# Patient Record
Sex: Male | Born: 2000
Health system: Southern US, Community
[De-identification: ages and names within clinical notes are randomized; demographics above are authoritative.]

## PROBLEM LIST (undated history)

## (undated) DIAGNOSIS — J45909 Unspecified asthma, uncomplicated: Secondary | ICD-10-CM

## (undated) DIAGNOSIS — F419 Anxiety disorder, unspecified: Secondary | ICD-10-CM

## (undated) HISTORY — DX: Unspecified asthma, uncomplicated: J45.909

## (undated) HISTORY — PX: TONSILLECTOMY AND ADENOIDECTOMY: SHX28

## (undated) HISTORY — DX: Anxiety disorder, unspecified: F41.9

---
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
## 2021-01-12 DIAGNOSIS — F4321 Adjustment disorder with depressed mood: Secondary | ICD-10-CM | POA: Diagnosis not present

## 2021-06-29 ENCOUNTER — Telehealth: Payer: Self-pay

## 2021-06-29 NOTE — Telephone Encounter (Signed)
Patient's mother Claris Gower) states child has to transition from pediatric and is wondering if Dr. Laury Axon would take him.

## 2021-06-30 NOTE — Telephone Encounter (Signed)
Scheduled

## 2021-07-20 ENCOUNTER — Other Ambulatory Visit: Payer: Self-pay

## 2021-07-20 ENCOUNTER — Ambulatory Visit: Payer: BC Managed Care – PPO | Admitting: Family Medicine

## 2021-07-20 ENCOUNTER — Encounter: Payer: Self-pay | Admitting: Family Medicine

## 2021-07-20 VITALS — BP 120/78 | HR 75 | Temp 97.7°F | Resp 18 | Ht 68.5 in | Wt 209.0 lb

## 2021-07-20 DIAGNOSIS — R002 Palpitations: Secondary | ICD-10-CM

## 2021-07-20 DIAGNOSIS — Z0000 Encounter for general adult medical examination without abnormal findings: Secondary | ICD-10-CM

## 2021-07-20 DIAGNOSIS — F419 Anxiety disorder, unspecified: Secondary | ICD-10-CM | POA: Diagnosis not present

## 2021-07-20 NOTE — Assessment & Plan Note (Signed)
Check labs  ekg-- nsr Event monitor  Most likely due to anxiety

## 2021-07-20 NOTE — Patient Instructions (Signed)
Preventive Care 18-19 Years Old, Male Preventive care refers to lifestyle choices and visits with your health care provider that can promote health and wellness. At this stage in your life, you may start seeing a primary care physician instead of a pediatrician. It is important to take responsibility for your health and well-being. Preventive care for young adults includes: A yearly physical exam. This is also called an annual wellness visit. Regular dental and eye exams. Immunizations. Screening for certain conditions. Healthy lifestyle choices, such as: Eating a healthy diet. Getting regular exercise. Not using drugs or products that contain nicotine and tobacco. Limiting alcohol use. What can I expect for my preventive care visit? Physical exam Your health care provider may check your: Height and weight. These may be used to calculate your BMI (body mass index). BMI is a measurement that tells if you are at a healthy weight. Heart rate and blood pressure. Body temperature. Skin for abnormal spots. Counseling Your health care provider may ask you questions about your: Past medical problems. Family's medical history. Alcohol, tobacco, and drug use. Home life and relationship well-being. Access to firearms. Emotional well-being. Diet, exercise, and sleep habits. Sexual activity and sexual health. What immunizations do I need?  Vaccines are usually given at various ages, according to a schedule. Your health care provider will recommend vaccines for you based on your age, medicalhistory, and lifestyle or other factors, such as travel or where you work. What tests do I need? Blood tests Lipid and cholesterol levels. These may be checked every 5 years starting at age 20. Hepatitis C test. Hepatitis B test. Screening Genital exam to check for testicular cancer or hernias. STD (sexually transmitted disease) testing, if you are at risk. Other tests Tuberculosis skin test. Vision and  hearing tests. Skin exam. Talk with your health care provider about your test results, treatment options,and if necessary, the need for more tests. Follow these instructions at home: Eating and drinking  Eat a healthy diet that includes fresh fruits and vegetables, whole grains, lean protein, and low-fat dairy products. Drink enough fluid to keep your urine pale yellow. Do not drink alcohol if: Your health care provider tells you not to drink. You are under the legal drinking age. In the [place] the legal drinking age is 21. If you drink alcohol: Limit how much you use to 0-2 drinks a day. Be aware of how much alcohol is in your drink. In the [place] one drink equals one 12 oz bottle of beer (355 mL), one 5 oz glass of wine (148 mL), or one 1 oz glass of hard liquor (44 mL).  Lifestyle Take daily care of your teeth and gums. Brush your teeth every morning and night with fluoride toothpaste. Floss one time each day. Stay active. Exercise for at least 30 minutes 5 or more days of the week. Do not use any products that contain nicotine or tobacco, such as cigarettes, e-cigarettes, and chewing tobacco. If you need help quitting, ask your health care provider. Do not use drugs. If you are sexually active, practice safe sex. Use a condom or other form of protection to prevent STIs (sexually transmitted infections). Find healthy ways to cope with stress, such as: Meditation, yoga, or listening to music. Journaling. Talking to a trusted person. Spending time with friends and family. Safety Always wear your seat belt while driving or riding in a vehicle. Do not drive: If you have been drinking alcohol. Do not ride with someone who has been drinking.   When you are tired or distracted. While texting. Wear a helmet and other protective equipment during sports activities. If you have firearms in your house, make sure you follow all gun safety procedures. Seek help if you have been bullied,  physically abused, or sexually abused. Use the Internet responsibly to avoid dangers, such as online bullying and online sex predators. What's next? Go to your health care provider once a year for an annual wellness visit. Ask your health care provider how often you should have your eyes and teeth checked. Stay up to date on all vaccines. This information is not intended to replace advice given to you by your health care provider. Make sure you discuss any questions you have with your healthcare provider. Document Revised: 08/19/2019 Document Reviewed: 11/27/2018 Elsevier Patient Education  2022 [employer].

## 2021-07-20 NOTE — Assessment & Plan Note (Signed)
Pt had been In counseling

## 2021-07-20 NOTE — Progress Notes (Signed)
Subjective:   By signing my name below, I, Shehryar Baig, attest that this documentation has been prepared under the direction and in the presence of Dr. Roma Schanz, DO. 07/20/2021    Patient ID: Robert Raymond, male    DOB: 2001-02-03, 20 y.o.   MRN: [id number]  Chief Complaint  Patient presents with   New Patient (Initial Visit)    Pt states wanting to discuss htn. Pt states he checked his bp at [facility] and it was high.    HPI Patient is in today for a new patient visit. He reports that he checked his blood pressure at public [employer] blood pressure cuff and found that his blood pressure is elevated. He thinks it is due to exercising prior to taking the measurement. He reports his mother and older brother have a history of hypertension.  He has a family history of hypertension, otherwise he does not know any other family history at this time.  He had asthma as a child. He repots having mild anxiety that has improved since he found it. He used to see a therapist to manage his symptoms but has stopped seeing her recently. He also notes having heart palpitations due to anxiety. He notes that since exercising regularly his episodes of heart palpitations have reduced but notes having a recent episode while at the beach. He mentions having constant stress due to anxiety but his heart palpations typically have random onsets but occur more frequently while under more stress. He occasionally drinks beer. He does not use any drugs. He is sexually active with one person. He uses condoms with his partner. He does smoke tobacco products. He used to use vaping products but has stopped using them since 8 months ago around January, 2022.  He participates in exercise by working out in the gym 5 days a week for around 1-2 hours. He also play golf as a hobby.   He reports being UTD on the HPV vaccine. He is due for the tetanus vaccine.  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  He is getting lab work after this visit. He last ate food at 12:00 pm. He denies having any joint pain and new moles to report at this time.     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  Tobacco Use   Smoking status: Never   Smokeless tobacco: Never  Vaping Use   Vaping Use: Former   Quit date: 12/28/2020  Substance and Sexual Activity   Alcohol use: Yes    Comment: <1 a week   Drug use: Never   Sexual activity: Yes    Partners: Female    Birth control/protection: Condom  Other Topics Concern   Not on file  Social History Narrative  Works out at gym-- 5 days a week 1.5 hours    Social Determinants of Radio broadcast assistant Strain: Not on file  Food Insecurity: Not on file  Transportation Needs: Not on file  Physical Activity: Not on file  Stress: Not on file  Social Connections: Not on file  Intimate Partner Violence: Not on file    No outpatient medications prior to visit.   No facility-administered medications prior to visit.    No Known  Allergies  Review of Systems  Musculoskeletal:  Negative for joint pain.  Skin:        (-)new moles  Psychiatric/Behavioral:  The patient is nervous/anxious.       Objective:    Physical Exam Constitutional:      General: He is not in acute distress.    Appearance: Normal appearance. He is not ill-appearing.  HENT:     Head: Normocephalic and atraumatic.     Right Ear: Tympanic membrane, ear canal and external ear normal.     Left Ear: Tympanic membrane, ear canal and external ear normal.  Eyes:     Extraocular Movements: Extraocular movements intact.     Pupils: Pupils are equal, round, and reactive to light.  Cardiovascular:     Rate and Rhythm: Normal rate and regular rhythm.     Heart sounds: Normal heart sounds. No murmur heard.   No gallop.  Pulmonary:     Effort: Pulmonary effort is normal. No respiratory distress.     Breath sounds: Normal breath sounds. No wheezing or rales.  Skin:    General: Skin is warm and dry.  Neurological:     Mental Status: He is alert and oriented to person, place, and time.  Psychiatric:        Behavior: Behavior normal.    BP 120/78 (BP Location: Right Arm, Patient Position: Sitting, Cuff Size: Large)   Pulse 75   Temp 97.7 F (36.5 C) (Oral)   Resp 18   Ht 5' 8.5" (1.74 m)   Wt 209 lb (94.8 kg)   SpO2 97%   BMI 31.32 kg/m  Wt Readings from Last 3 Encounters:  07/20/21 209 lb (94.8 kg) (95 %, Z= 1.67)*   * Growth percentiles are based on CDC (Boys, 2-20 Years) data.    Diabetic Foot Exam - Simple   No data filed    No results found for: WBC, HGB, HCT, PLT, GLUCOSE, CHOL, TRIG, HDL, LDLDIRECT, LDLCALC, ALT, AST, NA, K, CL, CREATININE, BUN, CO2, TSH, PSA, INR, GLUF, HGBA1C, MICROALBUR  No results found for: TSH No results found for: WBC, HGB, HCT, MCV, PLT No results found for: NA, K, CHLORIDE, CO2, GLUCOSE, BUN, CREATININE, BILITOT, ALKPHOS, AST, ALT, PROT, ALBUMIN, CALCIUM, ANIONGAP, EGFR, GFR No results found for:  CHOL No results found for: HDL No results found for: LDLCALC No results found for: TRIG No results found for: CHOLHDL No results found for: HGBA1C     Assessment & Plan:   Problem List Items Addressed This Visit       Unprioritized   Anxiety    Pt had been In counseling         Palpitation    Check labs  ekg-- nsr Event monitor  Most likely due to anxiety        Relevant Orders   EKG 12-Lead (Completed)   Cardiac event monitor   Preventative health care - Primary    ghm utd Check labs  See avs  Relevant Orders   Lipid panel   CBC with Differential/Platelet   TSH   Comprehensive metabolic panel     No orders of the defined types were placed in this encounter.   I, Dr. Roma Schanz, DO, personally preformed the services described in this documentation.  All medical record entries made by the scribe were at my direction and in my presence.  I have reviewed the chart and discharge instructions (if applicable) and agree that the record reflects my personal performance and is accurate and complete. 07/20/2021   I,Shehryar Baig,acting as a scribe for Ann Held, DO.,have documented all relevant documentation on the behalf of Ann Held, DO,as directed by  Ann Held, DO while in the presence of Ann Held, DO.   Ann Held, DO

## 2021-07-20 NOTE — Assessment & Plan Note (Signed)
ghm utd Check labs  See avs  

## 2021-07-21 LAB — LIPID PANEL
Cholesterol: 177 mg/dL (ref 0–200)
HDL: 39 mg/dL — ABNORMAL LOW (ref >39.00)
NonHDL: 138.27
Total CHOL/HDL Ratio: 5
Triglycerides: 223 mg/dL — ABNORMAL HIGH (ref 0.0–149.0)
VLDL: 44.6 mg/dL — ABNORMAL HIGH (ref 0.0–40.0)

## 2021-07-21 LAB — CBC WITH DIFFERENTIAL/PLATELET
Basophils Absolute: 0 10*3/uL (ref 0.0–0.1)
Basophils Relative: 0.7 % (ref 0.0–3.0)
Eosinophils Absolute: 0.1 10*3/uL (ref 0.0–0.7)
Eosinophils Relative: 1.6 % (ref 0.0–5.0)
HCT: 43.1 % (ref 36.0–49.0)
Hemoglobin: 14.6 g/dL (ref 12.0–16.0)
Lymphocytes Relative: 27.4 % (ref 24.0–48.0)
Lymphs Abs: 1.8 10*3/uL (ref 0.7–4.0)
MCHC: 33.8 g/dL (ref 31.0–37.0)
MCV: 85.8 fl (ref 78.0–98.0)
Monocytes Absolute: 0.9 10*3/uL (ref 0.1–1.0)
Monocytes Relative: 13.7 % — ABNORMAL HIGH (ref 3.0–12.0)
Neutro Abs: 3.7 10*3/uL (ref 1.4–7.7)
Neutrophils Relative %: 56.6 % (ref 43.0–71.0)
Platelets: 191 10*3/uL (ref 150.0–575.0)
RBC: 5.02 Mil/uL (ref 3.80–5.70)
RDW: 13.3 % (ref 11.4–15.5)
WBC: 6.5 10*3/uL (ref 4.5–13.5)

## 2021-07-21 LAB — TSH: TSH: 1.56 u[IU]/mL (ref 0.40–5.00)

## 2021-07-21 LAB — COMPREHENSIVE METABOLIC PANEL
ALT: 34 U/L (ref 0–53)
AST: 27 U/L (ref 0–37)
Albumin: 4.6 g/dL (ref 3.5–5.2)
Alkaline Phosphatase: 110 U/L (ref 52–171)
BUN: 21 mg/dL (ref 6–23)
CO2: 30 mEq/L (ref 19–32)
Calcium: 9.7 mg/dL (ref 8.4–10.5)
Chloride: 102 mEq/L (ref 96–112)
Creatinine, Ser: 1 mg/dL (ref 0.40–1.50)
GFR: 109.31 mL/min (ref >60.00)
Glucose, Bld: 86 mg/dL (ref 70–99)
Potassium: 4.7 mEq/L (ref 3.5–5.1)
Sodium: 139 mEq/L (ref 135–145)
Total Bilirubin: 0.5 mg/dL (ref 0.2–1.2)
Total Protein: 7.1 g/dL (ref 6.0–8.3)

## 2021-07-21 LAB — LDL CHOLESTEROL, DIRECT: Direct LDL: 118 mg/dL

## 2022-05-30 ENCOUNTER — Encounter: Payer: Self-pay | Admitting: Emergency Medicine

## 2022-05-30 ENCOUNTER — Ambulatory Visit
Admission: EM | Admit: 2022-05-30 | Discharge: 2022-05-30 | Disposition: A | Discharge status: Home / Self Care | Payer: 59 | Attending: Urgent Care | Admitting: Urgent Care

## 2022-05-30 DIAGNOSIS — H6981 Other specified disorders of Eustachian tube, right ear: Secondary | ICD-10-CM | POA: Diagnosis not present

## 2022-05-30 MED ORDER — LEVOCETIRIZINE DIHYDROCHLORIDE 5 MG PO TABS: 5.0000 mg | ORAL_TABLET | Freq: Every evening | ORAL | 0 refills | Status: DC

## 2022-05-30 MED ORDER — AMOXICILLIN 875 MG PO TABS: 875.0000 mg | ORAL_TABLET | Freq: Two times a day (BID) | ORAL | 0 refills | Status: DC

## 2022-05-30 MED ORDER — FLUTICASONE PROPIONATE 50 MCG/ACT NA SUSP: 2.0000 | Freq: Every day | NASAL | 12 refills | Status: DC

## 2022-05-30 MED ORDER — PSEUDOEPHEDRINE HCL 60 MG PO TABS: 60.0000 mg | ORAL_TABLET | Freq: Three times a day (TID) | ORAL | 0 refills | Status: DC | PRN

## 2022-05-30 NOTE — ED Provider Notes (Signed)
  [facility]   MRN: [id number] DOB: [birth year]  Subjective:   Oluwaseun Cremer is a 21 y.o. male presenting for 1 week history of persistent intermittent right ear fullness, popping.  Has had some intermittent pain as well.  No fever, drainage, tinnitus, dizziness.  Patient works in [employer] and has a history of childhood asthma, allergies.  Does not take anything consistently for this.  No current facility-administered medications for this encounter. No current outpatient medications on file.   No Known Allergies  Past Medical History:  Diagnosis Date   Anxiety    Childhood asthma      Past Surgical History:  Procedure Laterality Date   TONSILLECTOMY AND ADENOIDECTOMY      Family History  Problem Relation Age of Onset   Hypertension Mother    Hypertension Brother     Social History   Tobacco Use   Smoking status: Never   Smokeless tobacco: Never  Vaping Use   Vaping Use: Former   Quit date: 12/28/2020  Substance Use Topics   Alcohol use: Yes    Comment: <1 a week   Drug use: Never    ROS   Objective:   Vitals: BP 136/80   Pulse 73   Temp 98.6 F (37 C)   Resp 20   SpO2 98%   Physical Exam Constitutional:      General: He is not in acute distress.    Appearance: Normal appearance. He is well-developed and normal weight. He is not ill-appearing, toxic-appearing or diaphoretic.  HENT:     Head: Normocephalic and atraumatic.     Right Ear: Tympanic membrane, ear canal and external ear normal. There is no impacted cerumen.     Left Ear: Tympanic membrane, ear canal and external ear normal. There is no impacted cerumen.     Nose: Nose normal.     Mouth/Throat:     Pharynx: Oropharynx is clear.  Eyes:     General: No scleral icterus.       Right eye: No discharge.        Left eye: No discharge.     Extraocular Movements: Extraocular movements intact.  Cardiovascular:     Rate and Rhythm: Normal rate.  Pulmonary:      Effort: Pulmonary effort is normal.  Musculoskeletal:     Cervical back: Normal range of motion.  Neurological:     Mental Status: He is alert and oriented to person, place, and time.  Psychiatric:        Mood and Affect: Mood normal.        Behavior: Behavior normal.        Thought Content: Thought content normal.        Judgment: Judgment normal.    Assessment and Plan :   PDMP not reviewed this encounter.  1. Eustachian tube dysfunction, right    Unremarkable ENT exam.  Will use conservative management for what I suspect is eustachian tube dysfunction.  Recommended starting Flonase, Xyzal, Sudafed.  I did provide patient with a prescription for amoxicillin in the event that he has no improvement or develops worsening symptoms including fever, persistent pain then go ahead and fill prescription for amoxicillin to address otitis media.  Counseled patient on potential for adverse effects with medications prescribed/recommended today, ER and return-to-clinic precautions discussed, patient verbalized understanding.    Wallis Bamberg, PA-C 05/30/22 1120

## 2022-05-30 NOTE — ED Triage Notes (Signed)
Pt here with right ear pain and fullness x 1 week.

## 2022-07-23 ENCOUNTER — Encounter: Payer: BC Managed Care – PPO | Admitting: Family Medicine

## 2023-09-19 ENCOUNTER — Ambulatory Visit: Payer: Commercial Managed Care - PPO | Admitting: Physician Assistant

## 2023-09-19 ENCOUNTER — Encounter: Payer: Self-pay | Admitting: Physician Assistant

## 2023-09-19 VITALS — BP 126/76 | HR 91 | Temp 98.1°F | Resp 16 | Ht 68.5 in | Wt 220.2 lb

## 2023-09-19 DIAGNOSIS — R5383 Other fatigue: Secondary | ICD-10-CM

## 2023-09-19 DIAGNOSIS — R590 Localized enlarged lymph nodes: Secondary | ICD-10-CM | POA: Diagnosis not present

## 2023-09-19 LAB — CBC WITH DIFFERENTIAL/PLATELET
Basophils Absolute: 0.1 10*3/uL (ref 0.0–0.1)
Basophils Relative: 0.8 % (ref 0.0–3.0)
Eosinophils Absolute: 0.1 10*3/uL (ref 0.0–0.7)
Eosinophils Relative: 0.8 % (ref 0.0–5.0)
HCT: 43.1 % (ref 39.0–52.0)
Hemoglobin: 14.8 g/dL (ref 13.0–17.0)
Lymphocytes Relative: 35.5 % (ref 12.0–46.0)
Lymphs Abs: 2.6 10*3/uL (ref 0.7–4.0)
MCHC: 34.5 g/dL (ref 30.0–36.0)
MCV: 86.1 fL (ref 78.0–100.0)
Monocytes Absolute: 1.1 10*3/uL — ABNORMAL HIGH (ref 0.1–1.0)
Monocytes Relative: 15.4 % — ABNORMAL HIGH (ref 3.0–12.0)
Neutro Abs: 3.5 10*3/uL (ref 1.4–7.7)
Neutrophils Relative %: 47.5 % (ref 43.0–77.0)
Platelets: 164 10*3/uL (ref 150.0–400.0)
RBC: 5.01 Mil/uL (ref 4.22–5.81)
RDW: 12.6 % (ref 11.5–15.5)
WBC: 7.3 10*3/uL (ref 4.0–10.5)

## 2023-09-19 NOTE — Progress Notes (Signed)
Established patient visit   Patient: Robert Raymond   DOB: October 23, 2001   22 y.o. Male  MRN: [id number] Visit Date: 09/19/2023  Today's healthcare provider: Alfredia Ferguson, PA-C   Cc. Enlarged nodes in neck x 1 week  Subjective    HPI  Pt reports for the last week appreciating several painful lumps on the right side of his neck. Tender to touch. Denies recent illness. Some pain while swallowing. Reports some fatigue. He is also interested in getting his testosterone checked.  He sometimes experiences some pain along his left ribs with coughing/deep breathing that is new as well.   Medications: Outpatient Medications Prior to Visit  Medication Sig   [DISCONTINUED] amoxicillin (AMOXIL) 875 MG tablet Take 1 tablet (875 mg total) by mouth 2 (two) times daily. (Patient not taking: Reported on 09/19/2023)   [DISCONTINUED] fluticasone (FLONASE) 50 MCG/ACT nasal spray Place 2 sprays into both nostrils daily. (Patient not taking: Reported on 09/19/2023)   [DISCONTINUED] levocetirizine (XYZAL) 5 MG tablet Take 1 tablet (5 mg total) by mouth every evening. (Patient not taking: Reported on 09/19/2023)   [DISCONTINUED] pseudoephedrine (SUDAFED) 60 MG tablet Take 1 tablet (60 mg total) by mouth every 8 (eight) hours as needed for congestion. (Patient not taking: Reported on 09/19/2023)   No facility-administered medications prior to visit.    Review of Systems  Constitutional:  Negative for fatigue and fever.  Respiratory:  Negative for cough and shortness of breath.   Cardiovascular:  Negative for chest pain, palpitations and leg swelling.  Neurological:  Negative for dizziness and headaches.      Objective    BP 126/76   Pulse 91   Temp 98.1 F (36.7 C) (Oral)   Resp 16   Ht 5' 8.5" (1.74 m)   Wt 220 lb 4 oz (99.9 kg)   SpO2 97%   BMI 33.00 kg/m   Physical Exam Constitutional:      General: He is awake.     Appearance: He is well-developed.  HENT:     Head: Normocephalic.      Right Ear: Tympanic membrane normal.     Left Ear: Tympanic membrane normal.     Mouth/Throat:     Pharynx: Posterior oropharyngeal erythema present. No oropharyngeal exudate.     Comments: No tonsilar exudates or edema Eyes:     Conjunctiva/sclera: Conjunctivae normal.  Neck:     Comments: Right post cervical chain is enlarged -- but soft, mobile. Largest node is 1-2 cm No palpable mass felt right tonsilar node area where pt is tender. Overall some minimal edema to right side of neck when comparing bilaterally.   Cardiovascular:     Rate and Rhythm: Normal rate and regular rhythm.     Heart sounds: Normal heart sounds.  Pulmonary:     Effort: Pulmonary effort is normal.     Breath sounds: Normal breath sounds.  Skin:    General: Skin is warm.  Neurological:     Mental Status: He is alert and oriented to person, place, and time.  Psychiatric:        Attention and Perception: Attention normal.        Mood and Affect: Mood normal.        Speech: Speech normal.        Behavior: Behavior is cooperative.      Results for orders placed or performed in visit on 09/19/23  CBC w/Diff  Result Value Ref Range   WBC 7.3  4.0 - 10.5 K/uL   RBC 5.01 4.22 - 5.81 Mil/uL   Hemoglobin 14.8 13.0 - 17.0 g/dL   HCT 82.9 56.2 - 13.0 %   MCV 86.1 78.0 - 100.0 fl   MCHC 34.5 30.0 - 36.0 g/dL   RDW 86.5 78.4 - 69.6 %   Platelets 164.0 150.0 - 400.0 K/uL   Neutrophils Relative % 47.5 43.0 - 77.0 %   Lymphocytes Relative 35.5 12.0 - 46.0 %   Monocytes Relative 15.4 (H) 3.0 - 12.0 %   Eosinophils Relative 0.8 0.0 - 5.0 %   Basophils Relative 0.8 0.0 - 3.0 %   Neutro Abs 3.5 1.4 - 7.7 K/uL   Lymphs Abs 2.6 0.7 - 4.0 K/uL   Monocytes Absolute 1.1 (H) 0.1 - 1.0 K/uL   Eosinophils Absolute 0.1 0.0 - 0.7 K/uL   Basophils Absolute 0.1 0.0 - 0.1 K/uL    Assessment & Plan     1. Lymphadenopathy of right cervical region Will check cbc, ebv.  Advised pt to monitor -- if continues or worsens  over the next week to call/contact office and I would recommend an ultrasound.  - CBC w/Diff - Epstein-Barr virus VCA antibody panel  2. Other fatigue Per pt preference will check testosterone - Testosterone,Free and Total   Return if symptoms worsen or fail to improve.      I, Alfredia Ferguson, PA-C have reviewed all documentation for this visit. The documentation on  09/19/23   for the exam, diagnosis, procedures, and orders are all accurate and complete.    Alfredia Ferguson, PA-C  [facility] [place] [phone] (phone) [phone] (fax)  [facility]

## 2023-09-20 ENCOUNTER — Encounter: Payer: Self-pay | Admitting: Physician Assistant

## 2023-09-20 LAB — EPSTEIN-BARR VIRUS VCA ANTIBODY PANEL
EBV NA IgG: <18 U/mL
EBV VCA IgG: <18 U/mL
EBV VCA IgM: <36 U/mL

## 2023-09-22 LAB — TESTOSTERONE,FREE AND TOTAL
Testosterone, Free: 20 pg/mL (ref 9.3–26.5)
Testosterone: 238 ng/dL — ABNORMAL LOW (ref 264–916)

## 2023-09-23 ENCOUNTER — Other Ambulatory Visit: Payer: Self-pay | Admitting: Physician Assistant

## 2023-09-23 DIAGNOSIS — R590 Localized enlarged lymph nodes: Secondary | ICD-10-CM

## 2023-09-24 ENCOUNTER — Ambulatory Visit: Payer: Commercial Managed Care - PPO | Admitting: Physician Assistant

## 2024-05-28 ENCOUNTER — Other Ambulatory Visit: Payer: Self-pay

## 2024-05-28 ENCOUNTER — Ambulatory Visit
Admission: EM | Admit: 2024-05-28 | Discharge: 2024-05-28 | Disposition: A | Discharge status: Home / Self Care | Attending: Nurse Practitioner | Admitting: Nurse Practitioner

## 2024-05-28 DIAGNOSIS — L03116 Cellulitis of left lower limb: Secondary | ICD-10-CM | POA: Diagnosis not present

## 2024-05-28 DIAGNOSIS — Z23 Encounter for immunization: Secondary | ICD-10-CM | POA: Diagnosis not present

## 2024-05-28 MED ORDER — CEPHALEXIN 500 MG PO CAPS: 500.0000 mg | ORAL_CAPSULE | Freq: Four times a day (QID) | ORAL | 0 refills | Status: AC

## 2024-05-28 MED ORDER — TETANUS-DIPHTH-ACELL PERTUSSIS 5-2.5-18.5 LF-MCG/0.5 IM SUSY
0.5000 mL | PREFILLED_SYRINGE | Freq: Once | INTRAMUSCULAR | Status: AC
  Administered 2024-05-28: 0.5 mL via INTRAMUSCULAR

## 2024-05-28 MED ORDER — MUPIROCIN 2 % EX OINT: 1.0000 | TOPICAL_OINTMENT | Freq: Two times a day (BID) | CUTANEOUS | 0 refills | Status: AC

## 2024-05-28 NOTE — ED Triage Notes (Signed)
 Left leg pain r/t sliding into base during baseball. Has large abrasion. Had fever last night. Took ibuprofen.

## 2024-05-28 NOTE — ED Provider Notes (Signed)
 [name]    CSN: [id number] Arrival date & time: 05/28/24  1048      History   Chief Complaint Chief Complaint  Patient presents with   Leg Injury    HPI Robert Raymond is a 23 y.o. male.   Patient presents today with concern for infection of left lower extremity after he sustained a large abrasion playing softball 2 days ago.  Reports the abrasion occurred while sliding into second base.  He has been cleaning the area with peroxide, soap and water, and applying antiseptic ointment.  Reports after his softball game last night, he came home and felt like he had a fever with bodyaches and chills.  He also noted puslike discharge coming from the wound.  No nausea or vomiting.  No red streaking up the leg.  There is a little bit of swelling around the wound.  No numbness or tingling distal to the wound.    Past Medical History:  Diagnosis Date   Anxiety    Childhood asthma     Patient Active Problem List   Diagnosis Date Noted   Preventative health care 07/20/2021   Palpitation 07/20/2021   Anxiety 07/20/2021    Past Surgical History:  Procedure Laterality Date   TONSILLECTOMY AND ADENOIDECTOMY         Home Medications    Prior to Admission medications   Medication Sig Start Date End Date Taking? Authorizing Provider  cephALEXin (KEFLEX) 500 MG capsule Take 1 capsule (500 mg total) by mouth 4 (four) times daily for 5 days. 05/28/24 06/02/24 Yes Wilhemena Harbour, NP  mupirocin ointment (BACTROBAN) 2 % Apply 1 Application topically 2 (two) times daily for 5 days. 05/28/24 06/02/24 Yes Wilhemena Harbour, NP    Family History Family History  Problem Relation Age of Onset   Hypertension Mother    Hypertension Brother     Social History Social History   Tobacco Use   Smoking status: Some Days    Types: Cigarettes   Smokeless tobacco: Never  Vaping Use   Vaping status: Some Days  Substance Use Topics   Alcohol use: Yes    Comment: <1 a week    Drug use: Never     Allergies   Patient has no known allergies.   Review of Systems Review of Systems Per HPI  Physical Exam Triage Vital Signs ED Triage Vitals  Encounter Vitals Group     BP 05/28/24 1058 (!) 141/84     Girls Systolic BP Percentile --      Girls Diastolic BP Percentile --      Boys Systolic BP Percentile --      Boys Diastolic BP Percentile --      Pulse Rate 05/28/24 1058 79     Resp 05/28/24 1058 16     Temp 05/28/24 1058 97.9 F (36.6 C)     Temp src --      SpO2 05/28/24 1058 99 %     Weight --      Height --      Head Circumference --      Peak Flow --      Pain Score 05/28/24 1100 6     Pain Loc --      Pain Education --      Exclude from Growth Chart --    No data found.  Updated Vital Signs BP (!) 141/84   Pulse 79   Temp 97.9 F (36.6 C)   Resp  16   SpO2 99%   Visual Acuity Right Eye Distance:   Left Eye Distance:   Bilateral Distance:    Right Eye Near:   Left Eye Near:    Bilateral Near:     Physical Exam Vitals and nursing note reviewed.  Constitutional:      General: He is not in acute distress.    Appearance: Normal appearance. He is not toxic-appearing.  HENT:     Head: Normocephalic and atraumatic.     Mouth/Throat:     Mouth: Mucous membranes are moist.  Pulmonary:     Effort: Pulmonary effort is normal. No respiratory distress.   Skin:    General: Skin is warm and dry.     Capillary Refill: Capillary refill takes less than 2 seconds.     Coloration: Skin is not jaundiced or pale.     Findings: Abrasion present. No rash.     Comments: See attached photograph.  There is tenderness to palpation greater than 2 cm in distance from the wound edges.  No red streaking up the legs.  No significant edema or discoloration of the extremity distal to the wound.   Neurological:     Mental Status: He is alert and oriented to person, place, and time.   Psychiatric:        Behavior: Behavior is cooperative.         UC Treatments / Results  Labs (all labs ordered are listed, but only abnormal results are displayed) Labs Reviewed - No data to display  EKG   Radiology No results found.  Procedures Procedures (including critical care time)  Medications Ordered in UC Medications  Tdap (BOOSTRIX) injection 0.5 mL (0.5 mLs Intramuscular Given 05/28/24 1125)    Initial Impression / Assessment and Plan / UC Course  I have reviewed the triage vital signs and the nursing notes.  Pertinent labs & imaging results that were available during my care of the patient were reviewed by me and considered in my medical decision making (see chart for details).   Patient is well-appearing, normotensive, afebrile, not tachycardic, not tachypneic, oxygenating well on room air.   1. Cellulitis of left lower extremity Treat with Keflex 500 mg 4 times daily for 5 days, wound care discussed with patient Tdap updated today Return and ER precautions discussed  The patient was given the opportunity to ask questions.  All questions answered to their satisfaction.  The patient is in agreement to this plan.   Final Clinical Impressions(s) / UC Diagnoses   Final diagnoses:  Cellulitis of left lower extremity     Discharge Instructions      As we discussed, I am concerned the wound on your leg is getting infected.  We have updated your tetanus shot today.  Additionally, take the oral antibiotic 4 times a day for 5 days as prescribed to treat for infection in your skin.  Recommend cleaning the area twice daily at least with soap and water and then applying a thin layer of the mupirocin ointment over the abrasion.  Repeat this process twice daily until it heals.  Recommend avoidance of warm temperatures or wearing long pants that we will stick to the abrasion.  You can cover with nonadherent gauze and tape until it heals, but recommend doing this only for short periods of time.  Seek care if symptoms or not  improving after 48 hours of taking the oral antibiotic.  If you notice red streaking going up your leg, please  seek care emergently.     ED Prescriptions     Medication Sig Dispense Auth. Provider   cephALEXin (KEFLEX) 500 MG capsule Take 1 capsule (500 mg total) by mouth 4 (four) times daily for 5 days. 20 capsule Thena Fireman A, NP   mupirocin ointment (BACTROBAN) 2 % Apply 1 Application topically 2 (two) times daily for 5 days. 22 g Wilhemena Harbour, NP      PDMP not reviewed this encounter.   Wilhemena Harbour, NP 05/28/24 [phone]

## 2024-05-28 NOTE — Discharge Instructions (Signed)
 As we discussed, I am concerned the wound on your leg is getting infected.  We have updated your tetanus shot today.  Additionally, take the oral antibiotic 4 times a day for 5 days as prescribed to treat for infection in your skin.  Recommend cleaning the area twice daily at least with soap and water and then applying a thin layer of the mupirocin ointment over the abrasion.  Repeat this process twice daily until it heals.  Recommend avoidance of warm temperatures or wearing long pants that we will stick to the abrasion.  You can cover with nonadherent gauze and tape until it heals, but recommend doing this only for short periods of time.  Seek care if symptoms or not improving after 48 hours of taking the oral antibiotic.  If you notice red streaking going up your leg, please seek care emergently.

## 2024-08-25 ENCOUNTER — Ambulatory Visit (INDEPENDENT_AMBULATORY_CARE_PROVIDER_SITE_OTHER): Admitting: Family Medicine

## 2024-08-25 ENCOUNTER — Encounter: Payer: Self-pay | Admitting: Family Medicine

## 2024-08-25 VITALS — BP 124/86 | HR 70 | Temp 97.9°F | Resp 16 | Ht 68.5 in | Wt 192.4 lb

## 2024-08-25 DIAGNOSIS — Z0000 Encounter for general adult medical examination without abnormal findings: Secondary | ICD-10-CM

## 2024-08-25 DIAGNOSIS — R5383 Other fatigue: Secondary | ICD-10-CM

## 2024-08-25 NOTE — Assessment & Plan Note (Signed)
 Ghm utd Check labs  See AVS Health Maintenance  Topic Date Due   HIV Screening  Never done   Meningococcal B Vaccine (1 of 2 - Standard) Never done   Hepatitis C Screening  Never done   COVID-19 Vaccine (2 - 2025-26 season) 09/10/2024 (Originally 08/17/2024)   Influenza Vaccine  03/16/2025 (Originally 07/17/2024)   DTaP/Tdap/Td (8 - Td or Tdap) 05/28/2034   Hepatitis B Vaccines 19-59 Average Risk  Completed   HPV VACCINES  Completed   Pneumococcal Vaccine  Aged Out

## 2024-08-25 NOTE — Progress Notes (Signed)
 Subjective:    Patient ID: Robert Raymond, male    DOB: 06-03-2001, 23 y.o.   MRN: [id number]  Chief Complaint  Patient presents with   Annual Exam    Pt states not fasting     HPI Patient is in today for cpe.  Discussed the use of AI scribe software for clinical note transcription with the patient, who gave verbal consent to proceed.  History of Present Illness Robert Raymond is a 23 year old male who presents for an annual physical exam.  He has been experiencing high anxiety recently, though the cause is unclear. He has a history of anxiety but states it has been manageable and not uncontrollable. No other psychiatric symptoms reported.  He is concerned about his testosterone  levels, noting that previous blood work showed a total testosterone  level of 238 and a normal free testosterone  level.  He recently started a new job in inside [employer] for building [employer]. He lives with his parents and is sexually active with one partner, not using condoms or other birth control methods. He quit smoking cigarettes about six months ago and vaping two months ago, now using nicotine pouches called Zenz. He exercises five days a week and drinks alcohol only on special occasions.  No recent flu shot and has not visited the dentist in a while. He does not take any supplements and reports no issues with his stomach or joints. No concerns about mold exposure.    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  Tobacco  Use   Smoking status: Former    Current packs/day: 0.25    Average packs/day: 0.3 packs/day for 1.4 years (0.3 ttl pk-yrs)    Types: Cigarettes    Start date: 04/2023   Smokeless tobacco: Former  Vaping Use   Vaping status: Some Days   Start date: 08/25/2018   Devices: still uses  Substance and Sexual Activity   Alcohol use: Yes    Comment: <1 a week   Drug use: Never   Sexual activity: Yes    Partners: Female    Birth control/protection: Condom  Other Topics Concern   Not on file  Social History Narrative   Lives with parents   Works out 5 days a week  Works out at gym-- 5 days a week 1.5 hours    Social Drivers of Corporate investment banker Strain: Not on [employer] Insecurity: Not on file  Transportation Needs: Not on file  Physical Activity: Not on file  Stress: Not on file  Social Connections: Unknown (05/01/2022)   [facility]   Social Network    Social Network: Not on file  Intimate Partner Violence: Unknown (03/23/2022)   Received from [facility]   HITS    Physically Hurt: Not on file    Insult or Talk Down To: Not on file    Threaten Physical Harm: Not on file    Scream or Curse: Not on file    No outpatient medications prior to visit.   No facility-administered medications prior to visit.    No Known Allergies  Review of Systems  Constitutional:  Negative for chills, fever and malaise/fatigue.  HENT:  Negative for congestion and hearing loss.   Eyes:  Negative for blurred vision and discharge.  Respiratory:  Negative for  cough, sputum production and shortness of breath.   Cardiovascular:  Negative for chest pain, palpitations and leg swelling.  Gastrointestinal:  Negative for abdominal pain, blood in stool, constipation, diarrhea, heartburn, nausea and vomiting.  Genitourinary:  Negative for dysuria, frequency, hematuria and urgency.  Musculoskeletal:  Negative for back pain, falls and myalgias.  Skin:  Negative for rash.  Neurological:  Negative for dizziness, sensory change, loss of consciousness, weakness and headaches.  Endo/Heme/Allergies:  Negative for environmental allergies. Does not bruise/bleed easily.  Psychiatric/Behavioral:  Negative for depression and suicidal ideas. The patient is not nervous/anxious and does not have insomnia.        Objective:    Physical Exam Vitals and nursing note reviewed.  Constitutional:      General: He is not in acute distress.    Appearance: Normal appearance. He is well-developed.  HENT:     Head: Normocephalic and atraumatic.     Right Ear: Tympanic membrane, ear canal and external ear normal. There is no impacted cerumen.     Left Ear: Tympanic membrane, ear canal and external ear normal. There is no impacted cerumen.     Nose: Nose normal.     Mouth/Throat:     Mouth: Mucous membranes are moist.     Pharynx: Oropharynx is clear. No oropharyngeal exudate or posterior oropharyngeal erythema.  Eyes:     General: No scleral icterus.       Right eye: No discharge.        Left eye: No discharge.     Conjunctiva/sclera: Conjunctivae normal.     Pupils: Pupils are equal, round, and reactive to light.  Neck:     Thyroid: No thyromegaly.     Vascular: No JVD.  Cardiovascular:     Rate and Rhythm: Normal rate and regular rhythm.     Heart sounds: Normal heart sounds. No murmur heard. Pulmonary:     Effort: Pulmonary effort is normal. No respiratory distress.     Breath sounds: Normal breath sounds.  Abdominal:     General: Bowel sounds are normal. There  is no distension.     Palpations: Abdomen is soft. There is no mass.     Tenderness: There is no abdominal tenderness. There is no guarding or rebound.  Musculoskeletal:        General: Normal range of motion.     Cervical back: Normal range of motion and neck supple.  Right lower leg: No edema.     Left lower leg: No edema.  Lymphadenopathy:     Cervical: No cervical adenopathy.  Skin:    General: Skin is warm and dry.     Findings: No erythema or rash.  Neurological:     Mental Status: He is alert and oriented to person, place, and time.     Cranial Nerves: No cranial nerve deficit.     Motor: No abnormal muscle tone.     Deep Tendon Reflexes: Reflexes are normal and symmetric. Reflexes normal.  Psychiatric:        Mood and Affect: Mood normal.        Behavior: Behavior normal.        Thought Content: Thought content normal.        Judgment: Judgment normal.     BP 124/86 (BP Location: Right Arm, Patient Position: Sitting, Cuff Size: Normal)   Pulse 70   Temp 97.9 F (36.6 C) (Oral)   Resp 16   Ht 5' 8.5 (1.74 m)   Wt 192 lb 6.4 oz (87.3 kg)   SpO2 98%   BMI 28.83 kg/m  Wt Readings from Last 3 Encounters:  08/25/24 192 lb 6.4 oz (87.3 kg)  09/19/23 220 lb 4 oz (99.9 kg)  07/20/21 209 lb (94.8 kg) (95%, Z= 1.67)*   * Growth percentiles are based on CDC (Boys, 2-20 Years) data.    Diabetic Foot Exam - Simple   No data filed    Lab Results  Component Value Date   WBC 7.3 09/19/2023   HGB 14.8 09/19/2023   HCT 43.1 09/19/2023   PLT 164.0 09/19/2023   GLUCOSE 86 07/20/2021   CHOL 177 07/20/2021   TRIG 223.0 (H) 07/20/2021   HDL 39.00 (L) 07/20/2021   LDLDIRECT 118.0 07/20/2021   ALT 34 07/20/2021   AST 27 07/20/2021   NA 139 07/20/2021   K 4.7 07/20/2021   CL 102 07/20/2021   CREATININE 1.00 07/20/2021   BUN 21 07/20/2021   CO2 30 07/20/2021   TSH 1.56 07/20/2021    Lab Results  Component Value Date   TSH 1.56 07/20/2021   Lab Results   Component Value Date   WBC 7.3 09/19/2023   HGB 14.8 09/19/2023   HCT 43.1 09/19/2023   MCV 86.1 09/19/2023   PLT 164.0 09/19/2023   Lab Results  Component Value Date   NA 139 07/20/2021   K 4.7 07/20/2021   CO2 30 07/20/2021   GLUCOSE 86 07/20/2021   BUN 21 07/20/2021   CREATININE 1.00 07/20/2021   BILITOT 0.5 07/20/2021   ALKPHOS 110 07/20/2021   AST 27 07/20/2021   ALT 34 07/20/2021   PROT 7.1 07/20/2021   ALBUMIN 4.6 07/20/2021   CALCIUM 9.7 07/20/2021   GFR 109.31 07/20/2021   Lab Results  Component Value Date   CHOL 177 07/20/2021   Lab Results  Component Value Date   HDL 39.00 (L) 07/20/2021   No results found for: [facility] Lab Results  Component Value Date   TRIG 223.0 (H) 07/20/2021   Lab Results  Component Value Date   CHOLHDL 5 07/20/2021   No results found for: HGBA1C     Assessment & Plan:  Preventative health care Assessment & Plan: Ghm utd Check labs  See AVS Health Maintenance  Topic Date Due   HIV Screening  Never done   Meningococcal B Vaccine (1 of 2 - Standard) Never done   Hepatitis C Screening  Never done   COVID-19 Vaccine (2 - 2025-26 season) 09/10/2024 (Originally 08/17/2024)   Influenza Vaccine  03/16/2025 (Originally 07/17/2024)   DTaP/Tdap/Td (8 - Td or Tdap) 05/28/2034   Hepatitis B Vaccines 19-59 Average Risk  Completed   HPV VACCINES  Completed   Pneumococcal Vaccine  Aged Out     Orders: -     CBC with Differential/Platelet -     Comprehensive metabolic panel with GFR -     Lipid panel -     TSH  Other fatigue -     Testosterone    Assessment and Plan Assessment & Plan Adult Wellness Visit   A 23 year old male attended a routine wellness visit. There are no significant changes in his family history or current medical problems. He reports regular exercise and no recent dental visits. Vision correction is not needed. Order blood work to check testosterone  and cholesterol levels.  Anxiety disorder   He is  experiencing high but manageable anxiety with no specific trigger identified.  Nicotine dependence, current use of nicotine pouches   He currently uses nicotine pouches (Zenz) after quitting cigarettes and vaping. He previously smoked cigarettes occasionally and vaped since high school, transitioning to nicotine pouches approximately two months ago.   + Jamee JONELLE Antonio Cyndee, DO

## 2024-08-25 NOTE — Patient Instructions (Addendum)
 Preventive Care 11-22 Years Old, Male Preventive care refers to lifestyle choices and visits with your health care provider that can promote health and wellness. Preventive care visits are also called wellness exams. What can I expect for my preventive care visit? Counseling During your preventive care visit, your health care provider may ask about your: Medical history, including: Past medical problems. Family medical history. Current health, including: Emotional well-being. Home life and relationship well-being. Sexual activity. Lifestyle, including: Alcohol, nicotine or tobacco, and drug use. Access to firearms. Diet, exercise, and sleep habits. Safety issues such as seatbelt and bike helmet use. Sunscreen use. Work and work Astronomer. Physical exam Your health care provider may check your: Height and weight. These may be used to calculate your BMI (body mass index). BMI is a measurement that tells if you are at a healthy weight. Waist circumference. This measures the distance around your waistline. This measurement also tells if you are at a healthy weight and may help predict your risk of certain diseases, such as type 2 diabetes and high blood pressure. Heart rate and blood pressure. Body temperature. Skin for abnormal spots. What immunizations do I need?  Vaccines are usually given at various ages, according to a schedule. Your health care provider will recommend vaccines for you based on your age, medical history, and lifestyle or other factors, such as travel or where you work. What tests do I need? Screening Your health care provider may recommend screening tests for certain conditions. This may include: Lipid and cholesterol levels. Diabetes screening. This is done by checking your blood sugar (glucose) after you have not eaten for a while (fasting). Hepatitis B test. Hepatitis C test. HIV (human immunodeficiency virus) test. STI (sexually transmitted infection)  testing, if you are at risk. Talk with your health care provider about your test results, treatment options, and if necessary, the need for more tests. Follow these instructions at home: Eating and drinking  Eat a healthy diet that includes fresh fruits and vegetables, whole grains, lean protein, and low-fat dairy products. Drink enough fluid to keep your urine pale yellow. Take vitamin and mineral supplements as recommended by your health care provider. Do not drink alcohol if your health care provider tells you not to drink. If you drink alcohol: Limit how much you have to 0-2 drinks a day. Know how much alcohol is in your drink. In the [place] one drink equals one 12 oz bottle of beer (355 mL), one 5 oz glass of wine (148 mL), or one 1 oz glass of hard liquor (44 mL). Lifestyle Brush your teeth every morning and night with fluoride toothpaste. Floss one time each day. Exercise for at least 30 minutes 5 or more days each week. Do not use any products that contain nicotine or tobacco. These products include cigarettes, chewing tobacco, and vaping devices, such as e-cigarettes. If you need help quitting, ask your health care provider. Do not use drugs. If you are sexually active, practice safe sex. Use a condom or other form of protection to prevent STIs. Find healthy ways to manage stress, such as: Meditation, yoga, or listening to music. Journaling. Talking to a trusted person. Spending time with friends and family. Minimize exposure to UV radiation to reduce your risk of skin cancer. Safety Always wear your seat belt while driving or riding in a vehicle. Do not drive: If you have been drinking alcohol. Do not ride with someone who has been drinking. If you have been using any mind-altering substances  or drugs. While texting. When you are tired or distracted. Wear a helmet and other protective equipment during sports activities. If you have firearms in your house, make sure you  follow all gun safety procedures. Seek help if you have been physically or sexually abused. What's next? Go to your health care provider once a year for an annual wellness visit. Ask your health care provider how often you should have your eyes and teeth checked. Stay up to date on all vaccines. This information is not intended to replace advice given to you by your health care provider. Make sure you discuss any questions you have with your health care provider. Document Revised: 05/31/2021 Document Reviewed: 05/31/2021 Elsevier Patient Education  2024 [employer].

## 2024-08-26 LAB — LIPID PANEL
Cholesterol: 152 mg/dL (ref 0–200)
HDL: 39.6 mg/dL (ref >39.00)
LDL Cholesterol: 71 mg/dL (ref 0–99)
NonHDL: 112.65
Total CHOL/HDL Ratio: 4
Triglycerides: 207 mg/dL — ABNORMAL HIGH (ref 0.0–149.0)
VLDL: 41.4 mg/dL — ABNORMAL HIGH (ref 0.0–40.0)

## 2024-08-26 LAB — COMPREHENSIVE METABOLIC PANEL WITH GFR
ALT: 17 U/L (ref 0–53)
AST: 20 U/L (ref 0–37)
Albumin: 5 g/dL (ref 3.5–5.2)
Alkaline Phosphatase: 88 U/L (ref 39–117)
BUN: 26 mg/dL — ABNORMAL HIGH (ref 6–23)
CO2: 28 meq/L (ref 19–32)
Calcium: 9.9 mg/dL (ref 8.4–10.5)
Chloride: 103 meq/L (ref 96–112)
Creatinine, Ser: 1.12 mg/dL (ref 0.40–1.50)
GFR: 93.36 mL/min (ref >60.00)
Glucose, Bld: 94 mg/dL (ref 70–99)
Potassium: 4.8 meq/L (ref 3.5–5.1)
Sodium: 138 meq/L (ref 135–145)
Total Bilirubin: 0.5 mg/dL (ref 0.2–1.2)
Total Protein: 7.5 g/dL (ref 6.0–8.3)

## 2024-08-26 LAB — CBC WITH DIFFERENTIAL/PLATELET
Basophils Absolute: 0 K/uL (ref 0.0–0.1)
Basophils Relative: 0.5 % (ref 0.0–3.0)
Eosinophils Absolute: 0.1 K/uL (ref 0.0–0.7)
Eosinophils Relative: 1.3 % (ref 0.0–5.0)
HCT: 43.2 % (ref 39.0–52.0)
Hemoglobin: 14.6 g/dL (ref 13.0–17.0)
Lymphocytes Relative: 23.6 % (ref 12.0–46.0)
Lymphs Abs: 1.4 K/uL (ref 0.7–4.0)
MCHC: 33.8 g/dL (ref 30.0–36.0)
MCV: 85.9 fl (ref 78.0–100.0)
Monocytes Absolute: 0.6 K/uL (ref 0.1–1.0)
Monocytes Relative: 10.8 % (ref 3.0–12.0)
Neutro Abs: 3.8 K/uL (ref 1.4–7.7)
Neutrophils Relative %: 63.8 % (ref 43.0–77.0)
Platelets: 196 K/uL (ref 150.0–400.0)
RBC: 5.03 Mil/uL (ref 4.22–5.81)
RDW: 13.4 % (ref 11.5–15.5)
WBC: 6 K/uL (ref 4.0–10.5)

## 2024-08-26 LAB — TSH: TSH: 1.6 u[IU]/mL (ref 0.35–5.50)

## 2024-08-26 LAB — TESTOSTERONE: Testosterone: 234.77 ng/dL — ABNORMAL LOW (ref 300.00–890.00)

## 2024-08-28 ENCOUNTER — Ambulatory Visit: Payer: Self-pay | Admitting: Family Medicine

## 2024-08-28 DIAGNOSIS — R7989 Other specified abnormal findings of blood chemistry: Secondary | ICD-10-CM

## 2024-09-15 NOTE — Progress Notes (Incomplete)
    Chief Complaint: Low testosterone   History of Present Illness:  Robert Raymond is a 23 y.o. male who is seen in consultation from Antonio Meth, Jamee SAUNDERS, DO for evaluation of ***.  Most recent testosterone  levels- 09/2023--total testosterone  238, free testosterone  20 (lab drawn at 2 PM) 08/2024--total testosterone  235 (lab drawn at 4:13 PM) Past Medical History:  Past Medical History:  Diagnosis Date   Anxiety    Childhood asthma     Past Surgical History:  Past Surgical History:  Procedure Laterality Date   TONSILLECTOMY AND ADENOIDECTOMY      Allergies:  No Known Allergies  Family History:  Family History  Problem Relation Age of Onset   Hypertension Mother    Hypertension Brother     Social History:  Social History   Tobacco Use   Smoking status: Former    Current packs/day: 0.25    Average packs/day: 0.3 packs/day for 1.4 years (0.4 ttl pk-yrs)    Types: Cigarettes    Start date: 04/2023   Smokeless tobacco: Former  Advertising account planner   Vaping status: Some Days   Start date: 08/25/2018   Devices: still uses  Substance Use Topics   Alcohol use: Yes    Comment: <1 a week   Drug use: Never    Review of symptoms:  Constitutional:  Negative for unexplained weight loss, night sweats, fever, chills ENT:  Negative for nose bleeds, sinus pain, painful swallowing CV:  Negative for chest pain, shortness of breath, exercise intolerance, palpitations, loss of consciousness Resp:  Negative for cough, wheezing, shortness of breath GI:  Negative for nausea, vomiting, diarrhea, bloody stools GU:  Positives noted in HPI; otherwise negative for gross hematuria, dysuria, urinary incontinence Neuro:  Negative for seizures, poor balance, limb weakness, slurred speech Psych:  Negative for lack of energy, depression, anxiety Endocrine:  Negative for polydipsia, polyuria, symptoms of hypoglycemia (dizziness, hunger, sweating) Hematologic:  Negative for anemia, purpura, petechia,  prolonged or excessive bleeding, use of anticoagulants  Allergic:  Negative for difficulty breathing or choking as a result of exposure to anything; no shellfish allergy; no allergic response (rash/itch) to materials, foods  Physical exam: There were no vitals taken for this visit. GENERAL APPEARANCE:  Well appearing, well developed, well nourished, NAD HEENT: Atraumatic, Normocephalic. NECK: Normal appearance LUNGS: Normal inspiratory and expiratory excursion HEART: Regular Rate ABDOMEN: ***. GU: Phallus normal, no lesions. Scrotal skin normal. Testicles/epididymal structures normal. Meatus normal. Normal anal sphincter tone, prostate ***mL, symmetric, non nodular, non tender. EXTREMITIES: Moves all extremities well.  Without clubbing, cyanosis, or edema. NEUROLOGIC:  Alert and oriented x 3, normal gait, CN II-XII grossly intact.  MENTAL STATUS:  Appropriate. SKIN:  Warm, dry and intact.    Results: No results found for this or any previous visit (from the past 24 hours).  I have reviewed referring/prior physicians notes  I have reviewed urinalysis  I have reviewed PSA results  I have reviewed prior imaging  I have reviewed urine culture results  Assessment: ***   Plan: ***

## 2024-09-16 ENCOUNTER — Ambulatory Visit: Admitting: Urology

## 2024-09-16 DIAGNOSIS — R7989 Other specified abnormal findings of blood chemistry: Secondary | ICD-10-CM

## 2024-09-30 NOTE — Telephone Encounter (Signed)
 FYI

## 2024-10-10 ENCOUNTER — Ambulatory Visit
Admission: EM | Admit: 2024-10-10 | Discharge: 2024-10-10 | Disposition: A | Discharge status: Home / Self Care | Attending: Family Medicine | Admitting: Family Medicine

## 2024-10-10 ENCOUNTER — Encounter: Payer: Self-pay | Admitting: Emergency Medicine

## 2024-10-10 DIAGNOSIS — S0992XA Unspecified injury of nose, initial encounter: Secondary | ICD-10-CM

## 2024-10-10 NOTE — Discharge Instructions (Signed)
 May try saline nasal wash If bleeding is a problem use Afrin This should heal over time, I do not anticipate any deformity

## 2024-10-10 NOTE — ED Triage Notes (Signed)
 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  This morning he noticed a blood clot came from his nose and having some difficulty with breathing.  Denies any pain meds.

## 2024-10-10 NOTE — ED Provider Notes (Signed)
 Robert Raymond    CSN: [id number] Arrival date & time: 10/10/24  1109      History   Chief Complaint Chief Complaint  Patient presents with   Facial Injury    HPI Robert Raymond is a 23 y.o. male.   Patient states that he accidentally got kicked in the nose last night while at a jujitsu practice.  He states he cannot breathe out of 1 side of his nose today and thinks it is a blood clot up in his nostril.  Wants to know what to do    Past Medical History:  Diagnosis Date   Anxiety    Childhood asthma     Patient Active Problem List   Diagnosis Date Noted   Preventative health Raymond 07/20/2021   Palpitation 07/20/2021   Anxiety 07/20/2021    Past Surgical History:  Procedure Laterality Date   TONSILLECTOMY AND ADENOIDECTOMY         Home Medications    Prior to Admission medications   Not on File    Family History Family History  Problem Relation Age of Onset   Hypertension Mother    Hypertension Brother     Social History Social History   Tobacco Use   Smoking status: Former    Current packs/day: 0.25    Average packs/day: 0.3 packs/day for 1.5 years (0.4 ttl pk-yrs)    Types: Cigarettes    Start date: 04/2023   Smokeless tobacco: Former  Vaping Use   Vaping status: Former   Start date: 08/25/2018   Devices: still uses  Substance Use Topics   Alcohol use: Yes    Comment: <1 a week   Drug use: Never     Allergies   Patient has no known allergies.   Review of Systems Review of Systems  See HPI Physical Exam Triage Vital Signs ED Triage Vitals  Encounter Vitals Group     BP 10/10/24 1118 109/70     Girls Systolic BP Percentile --      Girls Diastolic BP Percentile --      Boys Systolic BP Percentile --      Boys Diastolic BP Percentile --      Pulse Rate 10/10/24 1118 69     Resp 10/10/24 1118 18     Temp 10/10/24 1118 (!) 97.5 F (36.4 C)     Temp Source 10/10/24 1118 Oral     SpO2 10/10/24 1118 95 %     Weight  10/10/24 1117 189 lb (85.7 kg)     Height 10/10/24 1117 5' 9 (1.753 m)     Head Circumference --      Peak Flow --      Pain Score 10/10/24 1117 3     Pain Loc --      Pain Education --      Exclude from Growth Chart --    No data found.  Updated Vital Signs BP 109/70 (BP Location: Right Arm)   Pulse 69   Temp (!) 97.5 F (36.4 C) (Oral)   Resp 18   Ht 5' 9 (1.753 m)   Wt 85.7 kg   SpO2 95%   BMI 27.91 kg/m       Physical Exam Constitutional:      General: He is not in acute distress.    Appearance: He is well-developed.  HENT:     Head: Normocephalic and atraumatic.     Nose: Nose normal.     Comments: The nose  appears normal.  There is no soft tissue swelling.  There is no palpable deformity.  There is blood inside the left nostril. Eyes:     Conjunctiva/sclera: Conjunctivae normal.     Pupils: Pupils are equal, round, and reactive to light.  Cardiovascular:     Rate and Rhythm: Normal rate.  Pulmonary:     Effort: Pulmonary effort is normal. No respiratory distress.  Musculoskeletal:        General: Normal range of motion.     Cervical back: Normal range of motion.  Skin:    General: Skin is warm and dry.  Neurological:     Mental Status: He is alert.      UC Treatments / Results  Labs (all labs ordered are listed, but only abnormal results are displayed) Labs Reviewed - No data to display  EKG   Radiology No results found.  Procedures Procedures (including critical Raymond time)  Medications Ordered in UC Medications - No data to display  Initial Impression / Assessment and Plan / UC Course  I have reviewed the triage vital signs and the nursing notes.  Pertinent labs & imaging results that were available during my Raymond of the patient were reviewed by me and considered in my medical decision making (see chart for details).     I explained that there was not any medical treatment necessary at this time. Final Clinical Impressions(s) / UC  Diagnoses   Final diagnoses:  Nasal injury, initial encounter     Discharge Instructions      May try saline nasal wash If bleeding is a problem use Afrin This should heal over time, I do not anticipate any deformity   ED Prescriptions   None    PDMP not reviewed this encounter.   Maranda Jamee Jacob, MD 10/10/24 [phone]

## 2024-10-12 NOTE — Progress Notes (Signed)
     CC: Abnormal testosterone  level   HPI: Robert Raymond here for E/M of low testosterone  levels.  Lab data-- 10.3.2024--total T 238  Free T 20.0 (collected @ 1539) 9.9.2025--Total T 235 (collected @ 1401)  Sx's--6 mos--decreased libido, fatigue.  Works theatre stage manager.  Practices jujitsu. PMH: Past Medical History:  Diagnosis Date   Anxiety    Childhood asthma     Surgical History: Past Surgical History:  Procedure Laterality Date   TONSILLECTOMY AND ADENOIDECTOMY      Home Medications:  Allergies as of 10/14/2024   No Known Allergies      Medication List    as of October 12, 2024  6:21 PM   You have not been prescribed any medications.     Allergies: No Known Allergies  Family History: Family History  Problem Relation Age of Onset   Hypertension Mother    Hypertension Brother     Social History:  reports that he has quit smoking. His smoking use included cigarettes. He started smoking about 17 months ago. He has a 0.4 pack-year smoking history. He has quit using smokeless tobacco. He reports current alcohol use. He reports that he does not use drugs.  ROS: All other review of systems were reviewed and are negative except what is noted above in HPI  Physical Exam: There were no vitals taken for this visit.  Constitutional:  Alert and oriented, No acute distress. HEENT: [place] AT, moist mucus membranes.  Trachea midline, no masses. Cardiovascular: No clubbing, cyanosis, or edema. Respiratory: Normal respiratory effort, no increased work of breathing. GI: No inguinal hernias GU: Normal phallus. No masses/lesions on penis, testis, scrotum.  Lymph: No cervical or inguinal lymphadenopathy. Skin: No rashes, bruises or suspicious lesions. Neurologic: Grossly intact, no focal deficits, moving all 4 extremities. Psychiatric: Normal mood and affect.  Laboratory Data: Lab Results  Component Value Date   WBC 6.0 08/25/2024   HGB 14.6 08/25/2024    HCT 43.2 08/25/2024   MCV 85.9 08/25/2024   PLT 196.0 08/25/2024    Lab Results  Component Value Date   CREATININE 1.12 08/25/2024    No results found for: PSA  Lab Results  Component Value Date   TESTOSTERONE  234.77 (L) 08/25/2024    No results found for: HGBA1C  Urinalysis  PCP notes reviewed.  Prior testosterone  levels reviewed     Assessment:  Low testosterone  level x 2, although checked outside the normal window, and in the afternoon  Plan:   I will have his testosterone  panel checked in the morning in the next week or 2.  I will notify him of results  Also discussed if repletion needed what I feel would be the most favorite method in a young man-clomiphene.  He agrees with this

## 2024-10-14 ENCOUNTER — Ambulatory Visit: Admitting: Urology

## 2024-10-14 VITALS — BP 148/76 | HR 72 | Ht 69.0 in | Wt 189.0 lb

## 2024-10-14 DIAGNOSIS — E291 Testicular hypofunction: Secondary | ICD-10-CM

## 2024-10-30 ENCOUNTER — Other Ambulatory Visit

## 2024-10-30 DIAGNOSIS — E291 Testicular hypofunction: Secondary | ICD-10-CM

## 2024-11-06 LAB — PROLACTIN: Prolactin: 14.2 ng/mL (ref 3.6–31.5)

## 2024-11-06 LAB — TESTOSTERONE,FREE AND TOTAL
Testosterone, Free: 19.4 pg/mL (ref 9.3–26.5)
Testosterone: 282 ng/dL (ref 264–916)

## 2024-11-09 ENCOUNTER — Telehealth: Payer: Self-pay

## 2024-11-09 ENCOUNTER — Other Ambulatory Visit: Payer: Self-pay | Admitting: Urology

## 2024-11-09 DIAGNOSIS — E291 Testicular hypofunction: Secondary | ICD-10-CM

## 2024-11-09 MED ORDER — CLOMIPHENE CITRATE 50 MG PO TABS: ORAL_TABLET | ORAL | 11 refills | Status: AC

## 2024-11-09 NOTE — Telephone Encounter (Signed)
 Pt called about his testosterone  lab results. Pt noted on MyChart over the weekend the test numbers, but he was wondering what his next steps were. Please advise.

## 2024-11-10 ENCOUNTER — Ambulatory Visit: Payer: Self-pay

## 2024-11-10 DIAGNOSIS — E291 Testicular hypofunction: Secondary | ICD-10-CM

## 2024-11-10 NOTE — Telephone Encounter (Signed)
-----   Message from Garnette HERO Dahlstedt sent at 11/09/2024  2:10 PM EST ----- Got your message--let pt know that T level was again low--280. I'll send in clomiphene  for him to start. Let him know thatit nmight be cheapest for him to pay cash for it. Set up OV for 2 mos--also  put in testosterone  panel (free and total) as well as estradiol and h/h to be drawn before ----- Message ----- From: Interface, [facility] Lab Results In Sent: 11/06/2024   2:36 PM EST To: Garnette Shack, MD

## 2024-11-10 NOTE — Telephone Encounter (Signed)
 I sent this message before I received yours. Spoke with pt and all appts have been made.

## 2024-11-10 NOTE — Telephone Encounter (Signed)
 Spoke with pt in reference to clomid , labs, and OV. Pt voiced understanding. Lab and OV appts made.

## 2024-12-14 ENCOUNTER — Ambulatory Visit: Payer: Self-pay

## 2024-12-22 NOTE — Progress Notes (Signed)
" ° ° ° °  CC: Abnormal testosterone  level   HPI:   10.15.2025: 24 yo male here for E/M of low testosterone  levels.  Lab data-- 10.3.2024--total T 238  Free T 20.0 (collected @ 1539) 9.9.2025--Total T 235 (collected @ 1401)  Sx's--6 mos--decreased libido, fatigue.  Works theatre stage manager.  Practices jujitsu.  PRL 14.2  11.24.2025: Initiated Rx w/ clomiphene  citrate 25 mg 2 days/week.  1.12.2026:  PMH: Past Medical History:  Diagnosis Date   Anxiety    Childhood asthma     Surgical History: Past Surgical History:  Procedure Laterality Date   TONSILLECTOMY AND ADENOIDECTOMY      Home Medications:  Allergies as of 12/28/2024   No Known Allergies      Medication List        Accurate as of December 22, 2024  7:20 PM. If you have any questions, ask your nurse or doctor.          clomiPHENE  50 MG tablet Commonly known as: CLOMID  Take 1/2 tab po on Mon-Wed-Fri        Allergies: No Known Allergies  Family History: Family History  Problem Relation Age of Onset   Hypertension Mother    Hypertension Brother     Social History:  reports that he has quit smoking. His smoking use included cigarettes. He started smoking about 20 months ago. He has a 0.4 pack-year smoking history. He has quit using smokeless tobacco. He reports current alcohol use. He reports that he does not use drugs.   Laboratory Data: Lab Results  Component Value Date   WBC 6.0 08/25/2024   HGB 14.6 08/25/2024   HCT 43.2 08/25/2024   MCV 85.9 08/25/2024   PLT 196.0 08/25/2024    Lab Results  Component Value Date   CREATININE 1.12 08/25/2024    No results found for: PSA  Lab Results  Component Value Date   TESTOSTERONE  282 10/30/2024    No results found for: HGBA1C  Urinalysis  PCP notes reviewed.  Prior testosterone  levels reviewed     Assessment:  Low testosterone  level x 2, although checked outside the normal window, and in the afternoon  Plan:    I will have his testosterone  panel checked in the morning in the next week or 2.  I will notify him of results  Also discussed if repletion needed what I feel would be the most favorite method in a young man-clomiphene .  He agrees with this   "

## 2024-12-24 ENCOUNTER — Other Ambulatory Visit

## 2024-12-24 DIAGNOSIS — E291 Testicular hypofunction: Secondary | ICD-10-CM

## 2024-12-26 LAB — ESTRADIOL: Estradiol: 37.4 pg/mL (ref 7.6–42.6)

## 2024-12-26 LAB — HEMOGLOBIN AND HEMATOCRIT, BLOOD
Hematocrit: 46.8 % (ref 37.5–51.0)
Hemoglobin: 15.6 g/dL (ref 13.0–17.7)

## 2024-12-26 LAB — TESTOSTERONE,FREE AND TOTAL
Testosterone, Free: 35.9 pg/mL — ABNORMAL HIGH (ref 9.3–26.5)
Testosterone: 562 ng/dL (ref 264–916)

## 2024-12-28 ENCOUNTER — Ambulatory Visit: Admitting: Urology

## 2024-12-28 ENCOUNTER — Encounter: Payer: Self-pay | Admitting: Urology

## 2024-12-28 VITALS — BP 131/79 | HR 79 | Ht 69.0 in | Wt 193.2 lb

## 2024-12-28 DIAGNOSIS — Z79890 Hormone replacement therapy (postmenopausal): Secondary | ICD-10-CM

## 2024-12-28 DIAGNOSIS — E291 Testicular hypofunction: Secondary | ICD-10-CM

## 2025-06-28 ENCOUNTER — Other Ambulatory Visit

## 2025-08-26 ENCOUNTER — Encounter: Admitting: Family Medicine

## 2025-12-29 ENCOUNTER — Ambulatory Visit: Admitting: Urology
